# Patient Record
Sex: Female | Born: 1970 | Race: White | Hispanic: No | Smoking: Never smoker
Health system: Southern US, Community
[De-identification: ages and names within clinical notes are randomized; demographics above are authoritative.]

## PROBLEM LIST (undated history)

## (undated) DIAGNOSIS — J302 Other seasonal allergic rhinitis: Secondary | ICD-10-CM

## (undated) DIAGNOSIS — N390 Urinary tract infection, site not specified: Secondary | ICD-10-CM

## (undated) HISTORY — PX: ADENOIDECTOMY: SHX5191

## (undated) HISTORY — PX: MANDIBLE FRACTURE SURGERY: SHX706

## (undated) HISTORY — PX: TUBAL LIGATION: SHX77

---

## 2001-12-16 ENCOUNTER — Encounter: Payer: Self-pay | Admitting: Oral Surgery

## 2001-12-16 ENCOUNTER — Ambulatory Visit (HOSPITAL_COMMUNITY): Admission: RE | Admit: 2001-12-16 | Discharge: 2001-12-16 | Payer: Self-pay | Admitting: Oral Surgery

## 2012-08-18 ENCOUNTER — Emergency Department (INDEPENDENT_AMBULATORY_CARE_PROVIDER_SITE_OTHER): Payer: Managed Care, Other (non HMO)

## 2012-08-18 ENCOUNTER — Encounter: Payer: Self-pay | Admitting: *Deleted

## 2012-08-18 ENCOUNTER — Emergency Department (INDEPENDENT_AMBULATORY_CARE_PROVIDER_SITE_OTHER)
Admission: EM | Admit: 2012-08-18 | Discharge: 2012-08-18 | Disposition: A | Discharge status: Home / Self Care | Payer: Managed Care, Other (non HMO) | Source: Home / Self Care

## 2012-08-18 DIAGNOSIS — N390 Urinary tract infection, site not specified: Secondary | ICD-10-CM

## 2012-08-18 DIAGNOSIS — W19XXXA Unspecified fall, initial encounter: Secondary | ICD-10-CM

## 2012-08-18 DIAGNOSIS — S93409A Sprain of unspecified ligament of unspecified ankle, initial encounter: Secondary | ICD-10-CM

## 2012-08-18 DIAGNOSIS — M25579 Pain in unspecified ankle and joints of unspecified foot: Secondary | ICD-10-CM

## 2012-08-18 HISTORY — DX: Other seasonal allergic rhinitis: J30.2

## 2012-08-18 HISTORY — DX: Urinary tract infection, site not specified: N39.0

## 2012-08-18 LAB — POCT URINALYSIS DIP (MANUAL ENTRY)
Bilirubin, UA: NEGATIVE
Glucose, UA: NEGATIVE
Ketones, POC UA: NEGATIVE
Nitrite, UA: POSITIVE
Protein Ur, POC: NEGATIVE
Spec Grav, UA: <=1.005 (ref 1.005–1.03)
Urobilinogen, UA: 0.2 (ref 0–1)
pH, UA: 6 (ref 5–8)

## 2012-08-18 MED ORDER — NITROFURANTOIN MONOHYD MACRO 100 MG PO CAPS: 100.0000 mg | ORAL_CAPSULE | Freq: Two times a day (BID) | ORAL | Status: AC

## 2012-08-18 NOTE — ED Notes (Signed)
Patient c/o intermittent left ankle pain x 10 days. She is currently training for a 5K, no previous injury to left ankle.  She also report dysuria and urinary frequency x 2 days. Jacqueline Rose has a urologists she sees annually for frequent utis.

## 2012-08-18 NOTE — ED Provider Notes (Signed)
History     CSN: [id number]  Arrival date & time 08/18/12  0806   First MD Initiated Contact with Patient 08/18/12 0815      Chief Complaint  Patient presents with  . Dysuria  . Ankle Pain    left   Patient is a 41 y.o. female presenting with ankle pain and dysuria.  Ankle Pain This is a new problem. The current episode started more than 1 week ago (Pt has been training for a 5k. Has noticed worsening pain after each episode of running. No pain with running. Pain predominantly in lateral ankle. Pain also worse with prolonged standing. No known trauma. Pain relieved with rest and returns with running.l). The symptoms are aggravated by standing (running ). The symptoms are relieved by rest and NSAIDs.  Dysuria   DYSURIA Onset:  2 days  Description: abd pain, dysuria, increased urinary frequency.  Modifying factors: Pt with prior hx/o recurrent UTIs. Has been seen by urology for this in the past. Was on post coital bactrim chronically. This has been effective in managing sxs. Ran out of abx. Has had return of sxs over last 2-3 days.   Symptoms Urgency:  yes Frequency: yes  Hesitancy:  no Hematuria:  no Flank Pain:  no Fever: no Nausea/Vomiting:  mild Missed LMP: no STD exposure: no Discharge: no Irritants: no Rash: no  Red Flags   More than 3 UTI's last 12 months:  2+ PMH of  Diabetes or Immunosuppression:  no Renal Disease/Calculi: no Urinary Tract Abnormality:  no Instrumentation or Trauma: no     Past Medical History  Diagnosis Date  . Frequent UTI   . Seasonal allergies     Past Surgical History  Procedure Date  . Cesarean section   . Adenoidectomy   . Tubal ligation   . Mandible fracture surgery     Family History  Problem Relation Age of Onset  . Cirrhosis Father   . Cancer Father     bladder CA  . Asthma Sister   . Irritable bowel syndrome Sister     History  Substance Use Topics  . Smoking status: Never Smoker   . Smokeless tobacco: Not  on file  . Alcohol Use: No    OB History    Grav Para Term Preterm Abortions TAB SAB Ect Mult Living                  Review of Systems  Genitourinary: Positive for dysuria.  All other systems reviewed and are negative.    Allergies  Ciprofloxacin; Morphine and related; and Penicillins  Home Medications   Current Outpatient Rx  Name Route Sig Dispense Refill  . CALCIUM CARBONATE 1250 MG PO CAPS Oral Take 1,250 mg by mouth 2 (two) times daily with a meal.    . CETIRIZINE HCL 10 MG PO TABS Oral Take 10 mg by mouth daily.    Marland Kitchen CRANBERRY EXTRACT PO Oral Take by mouth.    Marland Kitchen KRILL OIL PO Oral Take by mouth.    . MULTIVITAMINS PO CAPS Oral Take 1 capsule by mouth daily.      BP 113/67  Pulse 90  Temp 98.1 F (36.7 C) (Oral)  Resp 14  Ht 5\' 2"  (1.575 m)  Wt 115 lb (52.164 kg)  BMI 21.03 kg/m2  SpO2 100%  Physical Exam  Constitutional: She appears well-developed and well-nourished.  HENT:  Head: Normocephalic and atraumatic.  Eyes: Conjunctivae are normal. Pupils are equal, round, and reactive  to light.  Neck: Normal range of motion. Neck supple.  Cardiovascular: Normal rate and regular rhythm.   Pulmonary/Chest: Effort normal and breath sounds normal.  Abdominal: Soft.       Mild suprapubic tenderness    Musculoskeletal: Normal range of motion.       Feet:       Ankle: No visible erythema or swelling. Range of motion is full in all directions, though pain with resisted ankle eversion.  Strength is 5/5 in all directions. Stable lateral and medial ligaments; squeeze test and kleiger test unremarkable;  Talar dome nontender; No pain at base of 5th MT; No tenderness over cuboid; No tenderness over N spot or navicular prominence + tenderness over lateral malleolus.  No sign of peroneal tendon subluxations; Negative tarsal tunnel tinel's Able to walk 4 steps.   Neurological: She is alert.  Skin: Skin is warm.    ED Course  Procedures (including critical care  time)   Labs Reviewed  POCT URINALYSIS DIP (MANUAL ENTRY)  URINE CULTURE   No results found.   1. UTI (lower urinary tract infection)   2. Ankle sprain       MDM  1- Will culture. Treat with macrobid. Refer back to urology for follow up appt. Infectious red flags reviewed.   2- xrays negative for fracture. Will place ankle brace. RICE and NSAIDs. Follow up with sports medicine.         Doree Albee, MD 08/18/12 [phone]

## 2012-08-21 NOTE — ED Provider Notes (Signed)
Agree with exam, assessment, and plan.   Lattie Haw, MD 08/21/12 1431

## 2012-08-22 LAB — URINE CULTURE: Colony Count: >=100000

## 2014-05-12 ENCOUNTER — Encounter: Payer: Self-pay | Admitting: Emergency Medicine

## 2014-05-12 ENCOUNTER — Emergency Department (INDEPENDENT_AMBULATORY_CARE_PROVIDER_SITE_OTHER)
Admission: EM | Admit: 2014-05-12 | Discharge: 2014-05-12 | Disposition: A | Discharge status: Home / Self Care | Payer: Managed Care, Other (non HMO) | Source: Home / Self Care | Attending: Emergency Medicine | Admitting: Emergency Medicine

## 2014-05-12 DIAGNOSIS — N390 Urinary tract infection, site not specified: Secondary | ICD-10-CM

## 2014-05-12 DIAGNOSIS — R300 Dysuria: Secondary | ICD-10-CM

## 2014-05-12 LAB — POCT URINALYSIS DIP (MANUAL ENTRY)
BILIRUBIN UA: NEGATIVE
BILIRUBIN UA: NEGATIVE
Glucose, UA: NEGATIVE
Nitrite, UA: NEGATIVE
PROTEIN UA: NEGATIVE
SPEC GRAV UA: 1.01 (ref 1.005–1.03)
Urobilinogen, UA: 0.2 (ref 0–1)
pH, UA: 7 (ref 5–8)

## 2014-05-12 MED ORDER — CEFDINIR 300 MG PO CAPS: 300.0000 mg | ORAL_CAPSULE | Freq: Two times a day (BID) | ORAL | Status: DC

## 2014-05-12 NOTE — ED Provider Notes (Signed)
CSN: [id number]     Arrival date & time 05/12/14  [id number] History   First MD Initiated Contact with Patient 05/12/14 1006     Chief Complaint  Patient presents with  . Urinary Tract Infection   (Consider location/radiation/quality/duration/timing/severity/associated sxs/prior Treatment) HPI Jacqueline Rose is a 43 y.o. female who presents today with UTI symptoms for 1 day.  She has been seen by a urologist in the past but they no longer take her insurance.  She gets frequent UTIs approximately 3-4 times per year.  Per her, no structural defect seen by urologist.  Has been given Keflex in the past post coitus.  The last 2 times she had a UTI, the most recent one in January, she was given Cefdinir which did take care of the UTI. + dysuria + frequency + urgency + hematuria No vaginal discharge No fever/chills No lower abdominal pain + generalized bilatearl back pain No fatigue    Past Medical History  Diagnosis Date  . Frequent UTI   . Seasonal allergies    Past Surgical History  Procedure Laterality Date  . Cesarean section    . Adenoidectomy    . Tubal ligation    . Mandible fracture surgery     Family History  Problem Relation Age of Onset  . Cirrhosis Father   . Cancer Father     bladder CA  . Asthma Sister   . Irritable bowel syndrome Sister    History  Substance Use Topics  . Smoking status: Never Smoker   . Smokeless tobacco: Never Used  . Alcohol Use: No   OB History   Grav Para Term Preterm Abortions TAB SAB Ect Mult Living                 Review of Systems  All other systems reviewed and are negative.   Allergies  Ciprofloxacin; Morphine and related; and Penicillins  Home Medications   Prior to Admission medications   Medication Sig Start Date End Date Taking? Authorizing Provider  calcium carbonate 1250 MG capsule Take 1,250 mg by mouth 2 (two) times daily with a meal.    Historical Provider, MD  cetirizine (ZYRTEC) 10 MG tablet Take 10 mg by mouth daily.     Historical Provider, MD  CRANBERRY EXTRACT PO Take by mouth.    Historical Provider, MD  KRILL OIL PO Take by mouth.    Historical Provider, MD  Multiple Vitamin (MULTIVITAMIN) capsule Take 1 capsule by mouth daily.    Historical Provider, MD   BP 114/72  Pulse 88  Temp(Src) 98.1 F (36.7 C) (Oral)  Resp 14  Wt 118 lb (53.524 kg)  SpO2 100%  LMP 05/07/2014 Physical Exam  Nursing note and vitals reviewed. Constitutional: She is oriented to person, place, and time. She appears well-developed and well-nourished.  HENT:  Head: Normocephalic and atraumatic.  Eyes: No scleral icterus.  Neck: Neck supple.  Cardiovascular: Regular rhythm and normal heart sounds.   Pulmonary/Chest: Effort normal and breath sounds normal. No respiratory distress.  Abdominal: Soft. Normal appearance and bowel sounds are normal. She exhibits no mass. There is no rebound, no guarding and no CVA tenderness.  Neurological: She is alert and oriented to person, place, and time.  Skin: Skin is warm and dry.  Psychiatric: She has a normal mood and affect. Her speech is normal.    ED Course  Procedures (including critical care time) Labs Review Labs Reviewed  URINE CULTURE  POCT URINALYSIS DIP (MANUAL ENTRY)  Imaging Review No results found.   MDM   1. Dysuria   2. Urinary tract infection, site not specified    1) Take the prescribed antibiotic as directed.  Patient is allergic to Cipro hand supposedly has had some Bactrim resistance in the past.  Will place her on septa near and await the culture and sensitivity.  Also gave her [place] 2) A urinalysis was done in clinic.  A urine culture is pending. 3) Follow up with your PCP or urologist if not improving or if worsening symptoms.   Marlaine HindJeffrey H Lissete Maestas, MD 05/12/14 [phone]

## 2014-05-12 NOTE — ED Notes (Signed)
Jacqueline Rose reports hx of frequent UTIs. Seen by urologist but needs new one. Yesterday polyuria and dysuria. Today noted hematuria and flank discomfort. No fever

## 2014-05-15 LAB — URINE CULTURE: Colony Count: 60000

## 2014-05-16 ENCOUNTER — Telehealth: Payer: Self-pay

## 2014-05-16 NOTE — ED Notes (Signed)
I called and spoke with patient and she is doing better. I advised to call back if anything changes or if she has questions or concerns. I advised patient of urine culture results and to finish/complete antibiotic. Also advised to seek medical attention is symptoms persist or worsen.

## 2015-05-16 ENCOUNTER — Emergency Department (INDEPENDENT_AMBULATORY_CARE_PROVIDER_SITE_OTHER): Payer: Managed Care, Other (non HMO)

## 2015-05-16 ENCOUNTER — Emergency Department (INDEPENDENT_AMBULATORY_CARE_PROVIDER_SITE_OTHER)
Admission: EM | Admit: 2015-05-16 | Discharge: 2015-05-16 | Disposition: A | Discharge status: Home / Self Care | Payer: Managed Care, Other (non HMO) | Source: Home / Self Care | Attending: Family Medicine | Admitting: Family Medicine

## 2015-05-16 ENCOUNTER — Encounter: Payer: Self-pay | Admitting: *Deleted

## 2015-05-16 DIAGNOSIS — M25562 Pain in left knee: Secondary | ICD-10-CM | POA: Diagnosis not present

## 2015-05-16 DIAGNOSIS — M7632 Iliotibial band syndrome, left leg: Secondary | ICD-10-CM

## 2015-05-16 NOTE — ED Provider Notes (Signed)
CSN: [id number]     Arrival date & time 05/16/15  1107 History   First MD Initiated Contact with Patient 05/16/15 1234     Chief Complaint  Patient presents with  . Knee Pain      HPI Comments: Patient ran a half-marathon yesterday, and half-way into her run she developed pain in her left knee which she localizes around the patella and laterally.  She was able to finish her race.  She trains regularly and has not had similar pain in her left knee.  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  Her left knee has remained "stiff," and she has pain when climbing stairs.  Patient is a 44 y.o. female presenting with knee pain. The history is provided by the patient.  Knee Pain Location:  Knee Time since incident:  1 day Injury: no   Knee location:  L knee Pain details:    Quality:  Aching   Radiates to:  Does not radiate   Severity:  Mild   Onset quality:  Sudden   Duration:  1 day   Timing:  Constant   Progression:  Worsening Chronicity:  New Dislocation: no   Prior injury to area:  No Relieved by:  Nothing Worsened by:  Extension and flexion Ineffective treatments:  NSAIDs Associated symptoms: stiffness   Associated symptoms: no back pain, no decreased ROM, no muscle weakness, no numbness, no swelling and no tingling     Past Medical History  Diagnosis Date  . Frequent UTI   . Seasonal allergies    Past Surgical History  Procedure Laterality Date  . Cesarean section    . Adenoidectomy    . Tubal ligation    . Mandible fracture surgery     Family History  Problem Relation Age of Onset  . Cirrhosis Father   . Cancer Father     bladder CA  . Asthma Sister   . Irritable bowel syndrome Sister    History  Substance Use Topics  . Smoking status: Never Smoker   . Smokeless tobacco: Never Used  . Alcohol Use: No   OB History    No data available     Review of Systems  Musculoskeletal: Positive for stiffness. Negative for back pain.  All  other systems reviewed and are negative.   Allergies  Ciprofloxacin; Morphine and related; and Penicillins  Home Medications   Prior to Admission medications   Medication Sig Start Date End Date Taking? Authorizing Provider  calcium carbonate 1250 MG capsule Take 1,250 mg by mouth 2 (two) times daily with a meal.    Historical Provider, MD  cetirizine (ZYRTEC) 10 MG tablet Take 10 mg by mouth daily.    Historical Provider, MD  CRANBERRY EXTRACT PO Take by mouth.    Historical Provider, MD  KRILL OIL PO Take by mouth.    Historical Provider, MD  Multiple Vitamin (MULTIVITAMIN) capsule Take 1 capsule by mouth daily.    Historical Provider, MD   BP 120/71 mmHg  Pulse 89  Resp 16  Wt 119 lb (53.978 kg)  SpO2 100%  LMP 05/16/2015 Physical Exam  Constitutional: She appears well-developed and well-nourished. No distress.  Musculoskeletal:       Left knee: She exhibits bony tenderness. She exhibits normal range of motion, no swelling, no effusion, no ecchymosis, no deformity, no erythema, normal alignment, no LCL laxity, normal patellar mobility, normal meniscus and no MCL laxity. Tenderness found. No MCL,  no LCL and no patellar tendon tenderness noted.       Legs: Left knee:  No effusion, erythema, or warmth.  Knee stable, negative drawer test.  McMurray test negative. Distinct tenderness laterally over iliotibial band, worse with flexion/extension while palpating the area   Neurological: She is alert.  Skin: Skin is warm and dry. No rash noted. No erythema.  Nursing note and vitals reviewed.   ED Course  Procedures  none  Imaging Review Dg Knee Complete 4 Views Left  05/16/2015   CLINICAL DATA:  Left knee pain after running a half marathon.  EXAM: LEFT KNEE - COMPLETE 4+ VIEW  COMPARISON:  None.  FINDINGS: There is no evidence of fracture or dislocation. Small joint effusion. There is no evidence of arthropathy or other focal bone abnormality. Soft tissues are unremarkable.   IMPRESSION: No acute osseous injury of the left knee.   Electronically Signed   By: Elige KoHetal  Patel   On: 05/16/2015 12:22     MDM   1. Iliotibial band syndrome, left     Apply ice pack for 20 to 30 minutes, 3 to 4 times daily for 2 to 3 days until pain resolves.  Continue etodolac as prescribed.  Begin knee exercises. Followup with Dr. Rodney Langtonhomas Thekkekandam ([facility] in one week    Lattie HawStephen A Zaylee Cornia, MD 05/17/15 [phone]

## 2015-05-16 NOTE — Discharge Instructions (Signed)
Apply ice pack for 20 to 30 minutes, 3 to 4 times daily for 2 to 3 days until pain resolves.  Continue etodolac as prescribed.  Begin knee exercises.   Iliotibial Band Syndrome Iliotibial band syndrome is pain in the outer, lower thigh. The pain is caused by an inflammation of the iliotibial band. This is a band of thick fibrous tissue that runs down the outside of the thigh. The iliotibial band begins at the hip. It extends to the outer side of the shin bone (tibia) just below the knee joint. The band works with the thigh muscles. Together they provide stability to the outside of the knee joint. Iliotibial band syndrome occurs when there is inflammation to this band of tissue. This is typically due to over use and not due to an injury. The irritation usually occurs over the outside of the knee joint, at the the end of the thigh bone (femur). The iliotibial band crosses bone and muscle at this point. Between these structures is a cushioning sac (bursa). The bursa should make possible a smooth gliding motion. However, when inflamed, the iliotibial band does not glide easily. When inflamed, there is pain with motion of the knee. Usually the pain worsens with continued movement and the pain goes away with rest. This problem usually arises when there is a sudden increase in sports activities involving your legs. Running, and playing soccer or basketball are examples of activities causing this. Others who are prone to iliotibial band syndrome include individuals with mechanical problems such as leg length differences, abnormality of walking, bowed legs etc. HOME CARE INSTRUCTIONS   Apply ice to the injured area:  Put ice in a plastic bag.  Place a towel between your skin and the bag.  Leave the ice on for 20 minutes, 2-3 times a day.  Limit excessive training or eliminate training until pain goes away.  While pain is present, you may use gentle range of motion. Do not resume regular use until  instructed by your health care provider. Begin use gradually. Do not increase activity to the point of pain. If pain does develop, decrease activity and continue the above measures. Gradually increase activities that do not cause discomfort. Do this until you finally achieve normal use.  Perform low-impact activities while pain is present. Wear proper footwear.  Only take over-the-counter or prescription medicines for pain, discomfort, or fever as directed by your health care provider. SEEK MEDICAL CARE IF:   Your pain increases or pain is not controlled with medications.  You develop new, unexplained symptoms, or an increase of the symptoms that brought you to your health care provider.  Your pain and symptoms are not improving or are getting worse. Document Released: 06/08/2002 Document Revised: 10/07/2013 Document Reviewed: 07/16/2013 [facility] Patient Information 2015 [employer] [place] [place]. This information is not intended to replace advice given to you by your health care provider. Make sure you discuss any questions you have with your health care provider.

## 2015-05-16 NOTE — ED Notes (Signed)
Pt c/o left knee pain x yesterday while running a 1/2 Marathon. She did finish the race. No swelling present, no previous injury but does have arthritis in her knee. She is on Etodolac for right knee pain/fluid on knee in [place]ril.

## 2019-11-01 ENCOUNTER — Other Ambulatory Visit: Payer: Self-pay

## 2019-11-01 ENCOUNTER — Emergency Department (INDEPENDENT_AMBULATORY_CARE_PROVIDER_SITE_OTHER)
Admission: EM | Admit: 2019-11-01 | Discharge: 2019-11-01 | Disposition: A | Discharge status: Home / Self Care | Payer: 59 | Source: Home / Self Care

## 2019-11-01 ENCOUNTER — Encounter: Payer: Self-pay | Admitting: *Deleted

## 2019-11-01 DIAGNOSIS — R300 Dysuria: Secondary | ICD-10-CM

## 2019-11-01 DIAGNOSIS — N3001 Acute cystitis with hematuria: Secondary | ICD-10-CM

## 2019-11-01 LAB — POCT URINALYSIS DIP (MANUAL ENTRY)
Bilirubin, UA: NEGATIVE
Glucose, UA: NEGATIVE mg/dL
Ketones, POC UA: NEGATIVE mg/dL
Nitrite, UA: NEGATIVE
Protein Ur, POC: NEGATIVE mg/dL
Spec Grav, UA: 1.015 (ref 1.010–1.025)
Urobilinogen, UA: 0.2 E.U./dL
pH, UA: 7 (ref 5.0–8.0)

## 2019-11-01 MED ORDER — CEPHALEXIN 500 MG PO CAPS: 500.0000 mg | ORAL_CAPSULE | Freq: Two times a day (BID) | ORAL | 0 refills | Status: AC

## 2019-11-01 NOTE — Discharge Instructions (Signed)
°  Please take your antibiotic as prescribed. A urine culture has been sent to check the severity of your urinary infection and to determine if you are on the most appropriate antibiotic. The results should come back within 2-3 days and you will be notified even if no medication change is needed.  Please stay well hydrated and follow up with your family doctor in 4-5 days if not improving, sooner if worsening.

## 2019-11-01 NOTE — ED Provider Notes (Signed)
[facility]    CSN: [id number] Arrival date & time: 11/01/19  0809      History   Chief Complaint Chief Complaint  Patient presents with  . Dysuria    HPI Jacqueline Rose is a 48 y.o. female.   HPI  Jacqueline Rose is a 48 y.o. female presenting to UC with c/o 4 days worsening burning and irritation with urination.  Cloudy urine.  She is unsure of blood in urine as she is on her menstrual cycle but has not noticed any blood while peeing.  She reports hx of frequent UTIs in the past, has not had one in a few years but was advised about 6 weeks ago during her annual physical she had an asymptomatic UTI.  She was not treated with antibiotics due to not having symptoms but is concerned it has developed into a worse UTI now. Denies fever, chills, n/v/d.   Past Medical History:  Diagnosis Date  . Frequent UTI   . Seasonal allergies     There are no active problems to display for this patient.   Past Surgical History:  Procedure Laterality Date  . ADENOIDECTOMY    . CESAREAN SECTION    . MANDIBLE FRACTURE SURGERY    . TUBAL LIGATION      OB History   No obstetric history on file.      Home Medications    Prior to Admission medications   Medication Sig Start Date End Date Taking? Authorizing Provider  calcium carbonate 1250 MG capsule Take 1,250 mg by mouth 2 (two) times daily with a meal.   Yes [provider]  cetirizine (ZYRTEC) 10 MG tablet Take 10 mg by mouth daily.   Yes [provider]  CRANBERRY EXTRACT PO Take by mouth.   Yes [provider]  KRILL OIL PO Take by mouth.   Yes [provider]  Multiple Vitamin (MULTIVITAMIN) capsule Take 1 capsule by mouth daily.   Yes [provider]  cephALEXin (KEFLEX) 500 MG capsule Take 1 capsule (500 mg total) by mouth 2 (two) times daily. 11/01/19   Noe Gens, PA-C    Family History Family History  Problem Relation Age of Onset  . Cirrhosis Father   .  Cancer Father        bladder CA  . Asthma Sister   . Irritable bowel syndrome Sister     Social History Social History   Tobacco Use  . Smoking status: Never Smoker  . Smokeless tobacco: Never Used  Substance Use Topics  . Alcohol use: No  . Drug use: No     Allergies   Ciprofloxacin, Morphine and related, and Penicillins   Review of Systems Review of Systems  Constitutional: Negative for chills and fever.  Gastrointestinal: Positive for abdominal pain (mild bladder pressure). Negative for diarrhea, nausea and vomiting.  Genitourinary: Positive for dysuria, frequency, hematuria (? cloudy) and vaginal bleeding (on her menses ). Negative for vaginal discharge and vaginal pain.     Physical Exam Triage Vital Signs ED Triage Vitals  Enc Vitals Group     BP 11/01/19 0824 115/69     Pulse Rate 11/01/19 0824 85     Resp 11/01/19 0824 14     Temp 11/01/19 0824 98 F (36.7 C)     Temp Source 11/01/19 0824 Oral     SpO2 11/01/19 0824 100 %     Weight 11/01/19 0825 122 lb (55.3 kg)  Height 11/01/19 0825 5\' 2"  (1.575 m)     Head Circumference --      Peak Flow --      Pain Score 11/01/19 0825 0     Pain Loc --      Pain Edu? --      Excl. in GC? --    No data found.  Updated Vital Signs BP 115/69 (BP Location: Right Arm)   Pulse 85   Temp 98 F (36.7 C) (Oral)   Resp 14   Ht 5\' 2"  (1.575 m)   Wt 122 lb (55.3 kg)   LMP 10/30/2019   SpO2 100%   BMI 22.31 kg/m   Visual Acuity Right Eye Distance:   Left Eye Distance:   Bilateral Distance:    Right Eye Near:   Left Eye Near:    Bilateral Near:     Physical Exam Vitals signs and nursing note reviewed.  Constitutional:      Appearance: Normal appearance. She is well-developed.  HENT:     Head: Normocephalic and atraumatic.  Neck:     Musculoskeletal: Normal range of motion.  Cardiovascular:     Rate and Rhythm: Normal rate and regular rhythm.  Pulmonary:     Effort: Pulmonary effort is normal.      Breath sounds: Normal breath sounds.  Abdominal:     General: There is no distension.     Palpations: Abdomen is soft.     Tenderness: There is abdominal tenderness (mild, suprapubic). There is no right CVA tenderness, left CVA tenderness, guarding or rebound.  Musculoskeletal: Normal range of motion.  Skin:    General: Skin is warm and dry.  Neurological:     Mental Status: She is alert and oriented to person, place, and time.  Psychiatric:        Behavior: Behavior normal.      UC Treatments / Results  Labs (all labs ordered are listed, but only abnormal results are displayed) Labs Reviewed  POCT URINALYSIS DIP (MANUAL ENTRY) - Abnormal; Notable for the following components:      Result Value   Clarity, UA cloudy (*)    Blood, UA large (*)    Leukocytes, UA Small (1+) (*)    All other components within normal limits  URINE CULTURE  POCT URINALYSIS DIP (MANUAL ENTRY)    EKG   Radiology No results found.  Procedures Procedures (including critical care time)  Medications Ordered in UC Medications - No data to display  Initial Impression / Assessment and Plan / UC Course  I have reviewed the triage vital signs and the nursing notes.  Pertinent labs & imaging results that were available during my care of the patient were reviewed by me and considered in my medical decision making (see chart for details).     Hx and UA c/w UTI Urine Culture sent AVS provided  Final Clinical Impressions(s) / UC Diagnoses   Final diagnoses:  Dysuria  Acute cystitis with hematuria     Discharge Instructions      Please take your antibiotic as prescribed. A urine culture has been sent to check the severity of your urinary infection and to determine if you are on the most appropriate antibiotic. The results should come back within 2-3 days and you will be notified even if no medication change is needed.  Please stay well hydrated and follow up with your family doctor in 4-5  days if not improving, sooner if worsening.  ED Prescriptions    Medication Sig Dispense Auth. Provider   cephALEXin (KEFLEX) 500 MG capsule Take 1 capsule (500 mg total) by mouth 2 (two) times daily. 14 capsule Lurene Shadow, [place]     PDMP not reviewed this encounter.   Lurene Shadow, [place] 11/01/19 [phone]

## 2019-11-01 NOTE — ED Triage Notes (Addendum)
Patient c/o dysuria x 4 days. No otc meds taken. H/o frequent UTI several years ago. Recent UTI 6 weeks ago

## 2019-11-03 LAB — URINE CULTURE
MICRO NUMBER:: 1053581
SPECIMEN QUALITY:: ADEQUATE

## 2019-11-05 ENCOUNTER — Telehealth: Payer: Self-pay | Admitting: Emergency Medicine

## 2019-11-05 NOTE — Telephone Encounter (Signed)
Urine culture was positive for  E coli and was given keflex  at urgent care visit. Attempted to reach patient. No answer at this time.

## 2021-01-19 ENCOUNTER — Encounter: Payer: Self-pay | Admitting: Emergency Medicine

## 2021-01-19 ENCOUNTER — Emergency Department (INDEPENDENT_AMBULATORY_CARE_PROVIDER_SITE_OTHER): Payer: PRIVATE HEALTH INSURANCE

## 2021-01-19 ENCOUNTER — Emergency Department (INDEPENDENT_AMBULATORY_CARE_PROVIDER_SITE_OTHER)
Admission: EM | Admit: 2021-01-19 | Discharge: 2021-01-19 | Disposition: A | Discharge status: Home / Self Care | Payer: PRIVATE HEALTH INSURANCE | Source: Home / Self Care

## 2021-01-19 ENCOUNTER — Other Ambulatory Visit: Payer: Self-pay

## 2021-01-19 DIAGNOSIS — W010XXA Fall on same level from slipping, tripping and stumbling without subsequent striking against object, initial encounter: Secondary | ICD-10-CM

## 2021-01-19 DIAGNOSIS — S63601A Unspecified sprain of right thumb, initial encounter: Secondary | ICD-10-CM

## 2021-01-19 DIAGNOSIS — M79641 Pain in right hand: Secondary | ICD-10-CM | POA: Diagnosis not present

## 2021-01-19 NOTE — ED Triage Notes (Signed)
Pt slipped on ice on Monday., landed on R hand & thumb Bruising and swelling noted  No OTC meds Limited ROM

## 2021-01-19 NOTE — Discharge Instructions (Signed)
  You may alternate tylenol and motrin as needed for pain and inflammation. You should wear the wrist splint for 1-2 weeks for comfort and protection. You may briefly remove to bath or apply a cool compress. If not improving in 1-2 weeks, call to schedule a follow up appointment with Sports Medicine for further evaluation and treatment.

## 2021-01-19 NOTE — ED Provider Notes (Signed)
[name]    CSN: [id number] Arrival date & time: 01/19/21  0859      History   Chief Complaint Chief Complaint  Patient presents with  . Hand Injury    Right    HPI Jacqueline Rose is a 50 y.o. female.   HPI Jacqueline Rose is a 50 y.o. female presenting to UC with c/o Right thumb and hand pain with mild swelling and bruising that started 3 days ago after falling and slipping on some ice while shoveling snow.  Pain is aching and sore, 3/10, worse with flexing her thumb fully.  She is Right hand dominant.    Past Medical History:  Diagnosis Date  . Frequent UTI   . Seasonal allergies     There are no problems to display for this patient.   Past Surgical History:  Procedure Laterality Date  . ADENOIDECTOMY    . CESAREAN SECTION    . MANDIBLE FRACTURE SURGERY    . TUBAL LIGATION      OB History   No obstetric history on file.      Home Medications    Prior to Admission medications   Medication Sig Start Date End Date Taking? Authorizing Provider  calcium carbonate 1250 MG capsule Take 1,250 mg by mouth 2 (two) times daily with a meal.   Yes [provider]  Ferrous Sulfate (IRON) 325 (65 Fe) MG TABS Take by mouth.   Yes [provider]  KRILL OIL PO Take by mouth.   Yes [provider]  Multiple Vitamin (MULTIVITAMIN) capsule Take 1 capsule by mouth daily.   Yes [provider]  cephALEXin (KEFLEX) 500 MG capsule Take 1 capsule (500 mg total) by mouth 2 (two) times daily. Patient not taking: Reported on 01/19/2021 11/01/19   Lurene Shadow, PA-C  cetirizine (ZYRTEC) 10 MG tablet Take 10 mg by mouth daily.    [provider]  CRANBERRY EXTRACT PO Take by mouth.    [provider]    Family History Family History  Problem Relation Age of Onset  . Cirrhosis Father   . Cancer Father        bladder CA  . Asthma Sister   . Irritable bowel syndrome Sister   . Supraventricular tachycardia Mother      Social History Social History   Tobacco Use  . Smoking status: Never Smoker  . Smokeless tobacco: Never Used  Vaping Use  . Vaping Use: Never used  Substance Use Topics  . Alcohol use: No  . Drug use: No     Allergies   Ciprofloxacin, Morphine and related, and Penicillins   Review of Systems Review of Systems  Musculoskeletal: Positive for arthralgias and joint swelling.  Skin: Positive for color change. Negative for wound.  Neurological: Positive for weakness ( right thumb). Negative for numbness.     Physical Exam Triage Vital Signs ED Triage Vitals  Enc Vitals Group     BP 01/19/21 0918 106/63     Pulse Rate 01/19/21 0918 90     Resp 01/19/21 0918 15     Temp 01/19/21 0918 98.5 F (36.9 C)     Temp Source 01/19/21 0918 Oral     SpO2 01/19/21 0918 99 %     Weight 01/19/21 0922 117 lb (53.1 kg)     Height 01/19/21 0922 5' 2.5" (1.588 m)     Head Circumference --      Peak Flow --  Pain Score 01/19/21 0921 3     Pain Loc --      Pain Edu? --      Excl. in GC? --    No data found.  Updated Vital Signs BP 106/63 (BP Location: Left Arm)   Pulse 90   Temp 98.5 F (36.9 C) (Oral)   Resp 15   Ht 5' 2.5" (1.588 m)   Wt 117 lb (53.1 kg)   LMP 01/12/2021 (Exact Date)   SpO2 99%   BMI 21.06 kg/m   Visual Acuity Right Eye Distance:   Left Eye Distance:   Bilateral Distance:    Right Eye Near:   Left Eye Near:    Bilateral Near:     Physical Exam Vitals and nursing note reviewed.  Constitutional:      Appearance: Normal appearance. She is well-developed and well-nourished.  HENT:     Head: Normocephalic and atraumatic.  Eyes:     Extraocular Movements: EOM normal.  Cardiovascular:     Rate and Rhythm: Normal rate and regular rhythm.     Pulses:          Radial pulses are 2+ on the right side.  Pulmonary:     Effort: Pulmonary effort is normal.  Musculoskeletal:        General: Swelling and tenderness present. Normal range of motion.      Cervical back: Normal range of motion.     Comments: Right thumb: mild edema at first MCP joint, tender, slight decreased flexion due to pain. Full ROM at PIP joint.  No snuffbox tenderness. Full ROM Right wrist, non-tender.  Skin:    General: Skin is warm and dry.     Capillary Refill: Capillary refill takes less than 2 seconds.     Findings: Bruising (faint over proximal thumb and first metacarpal) present.  Neurological:     Mental Status: She is alert and oriented to person, place, and time.     Sensory: No sensory deficit.  Psychiatric:        Mood and Affect: Mood and affect normal.        Behavior: Behavior normal.      UC Treatments / Results  Labs (all labs ordered are listed, but only abnormal results are displayed) Labs Reviewed - No data to display  EKG   Radiology DG Hand Complete Right  Result Date: 01/19/2021 CLINICAL DATA:  Right hand pain after fall. EXAM: RIGHT HAND - COMPLETE 3+ VIEW COMPARISON:  None. FINDINGS: There is no evidence of fracture or dislocation. There is no evidence of arthropathy or other focal bone abnormality. Soft tissues are unremarkable. IMPRESSION: Negative. Electronically Signed   By: Lupita Raider M.D.   On: 01/19/2021 09:50    Procedures Procedures (including critical care time)  Medications Ordered in UC Medications - No data to display  Initial Impression / Assessment and Plan / UC Course  I have reviewed the triage vital signs and the nursing notes.  Pertinent labs & imaging results that were available during my care of the patient were reviewed by me and considered in my medical decision making (see chart for details).     Discussed imaging with pt. Will tx as sprain Thumb spica splint applied for comfort and protection F/u with Sports medicine in 1-2 weeks if not improving.  Final Clinical Impressions(s) / UC Diagnoses   Final diagnoses:  Sprain of right thumb, unspecified site of digit, initial encounter      Discharge Instructions  You may alternate tylenol and motrin as needed for pain and inflammation. You should wear the wrist splint for 1-2 weeks for comfort and protection. You may briefly remove to bath or apply a cool compress. If not improving in 1-2 weeks, call to schedule a follow up appointment with Sports Medicine for further evaluation and treatment.     ED Prescriptions    None     PDMP not reviewed this encounter.   Lurene Shadow, PA-C 01/19/21 1000

## 2022-03-30 IMAGING — DX DG HAND COMPLETE 3+V*R*
3 series · 3 of 3 positions shown · non-contrast
Comparison: None.

CLINICAL DATA: Right hand pain after fall.

EXAM:
RIGHT HAND - COMPLETE 3+ VIEW

[hand pa]
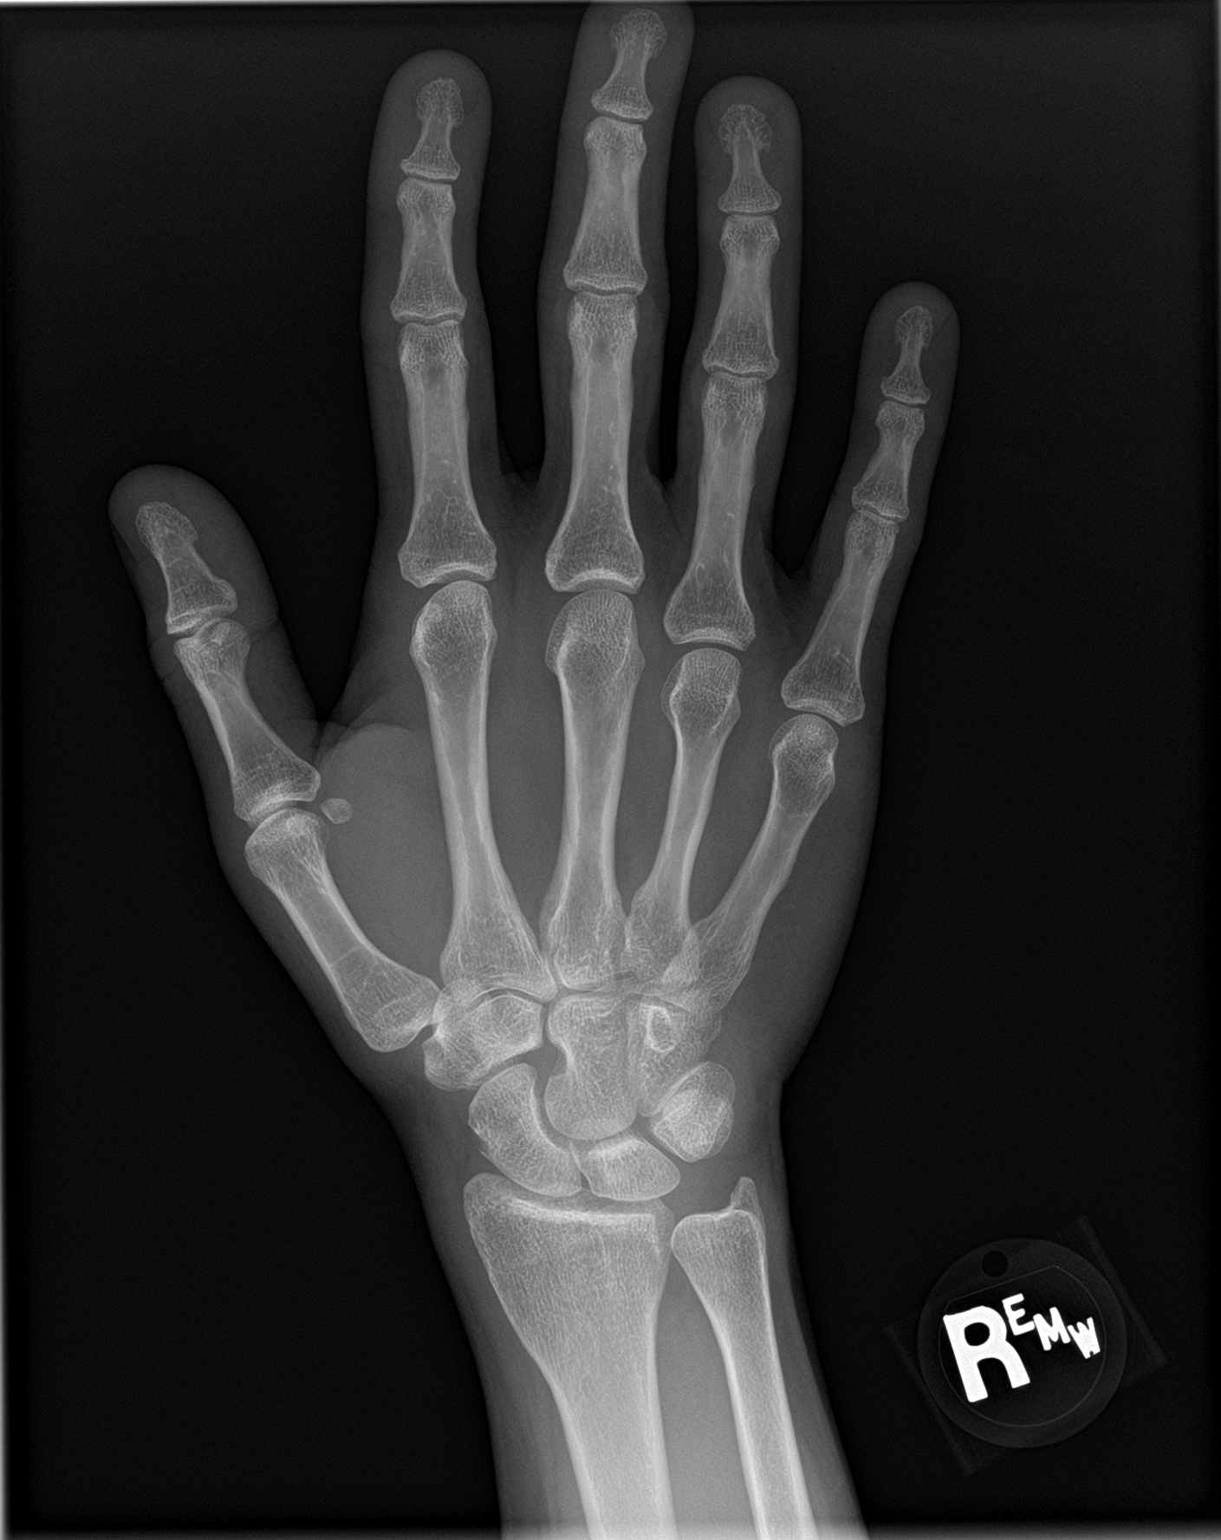

[hand obl]
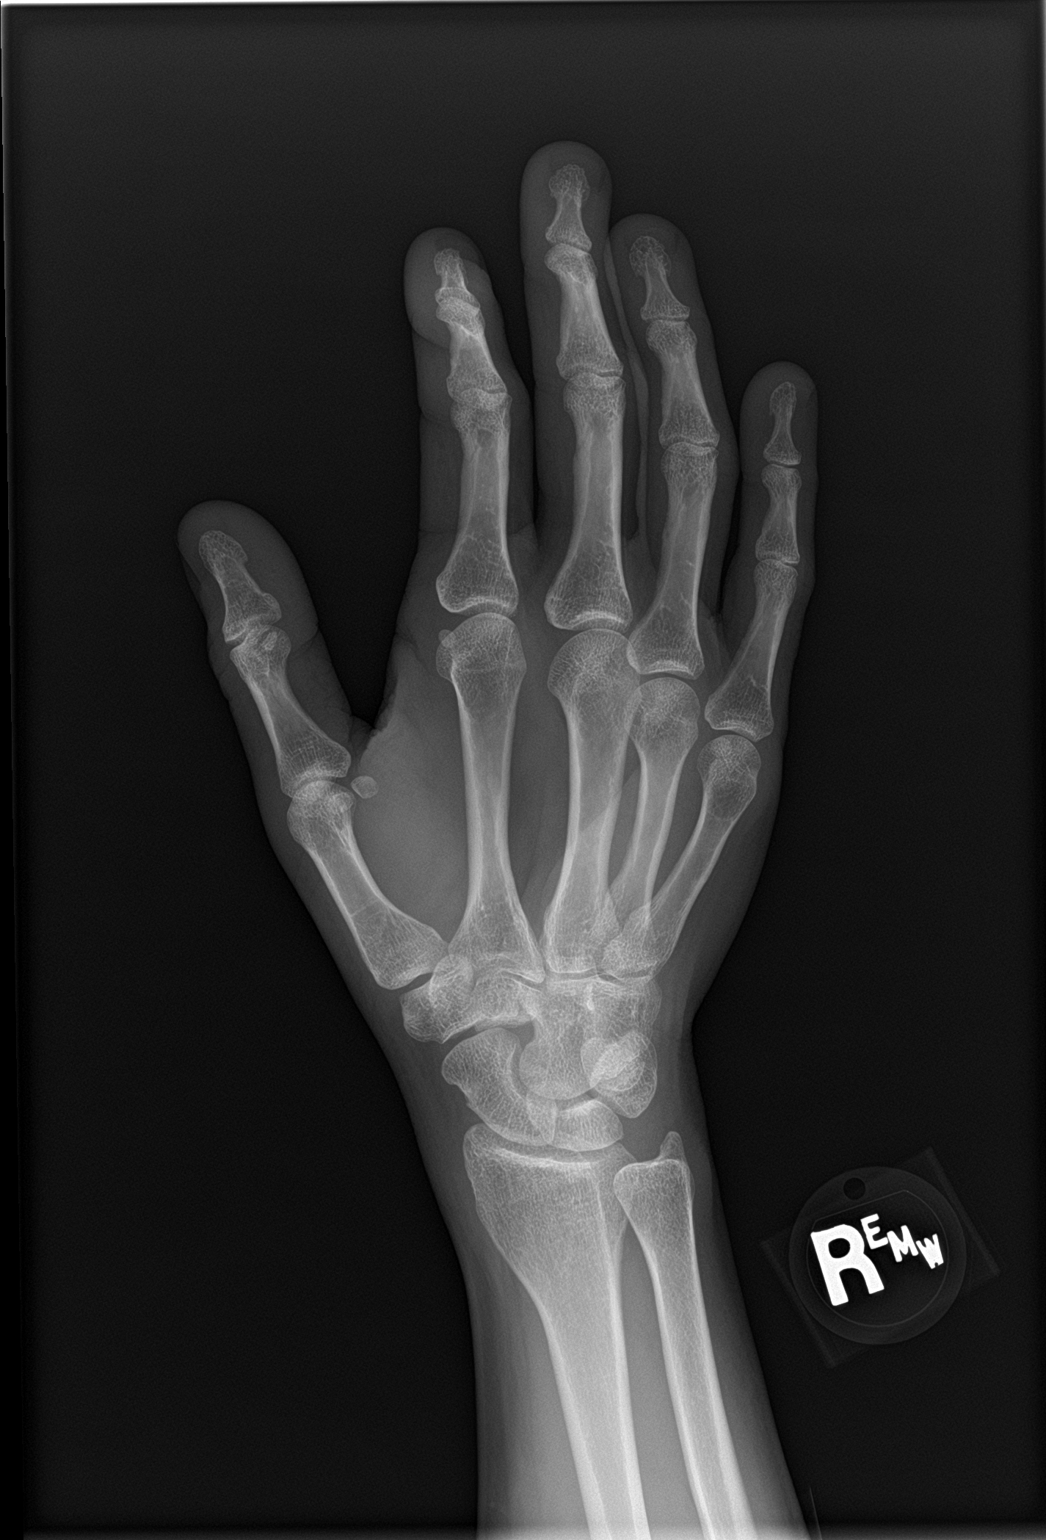

[hand lat]
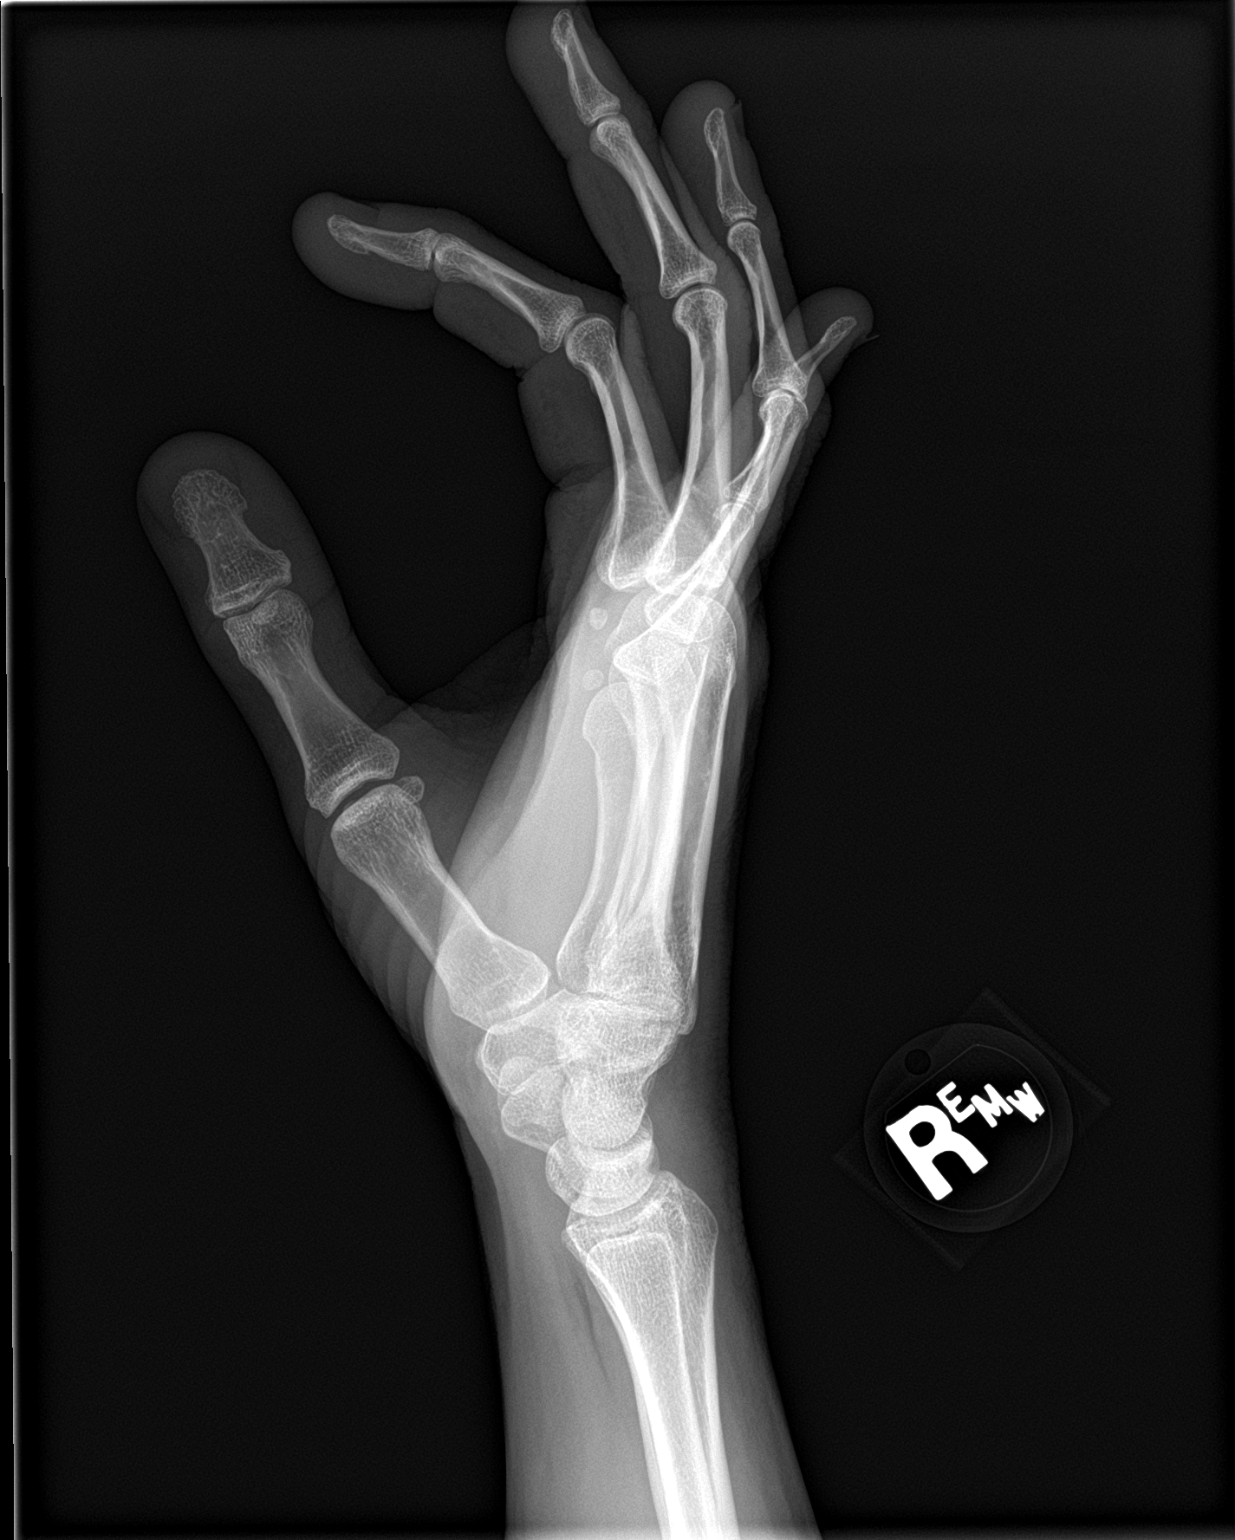

[3 of 3 positions shown; findings below may reference images not displayed]

FINDINGS: There is no evidence of fracture or dislocation. There is no
evidence of arthropathy or other focal bone abnormality. Soft
tissues are unremarkable.
IMPRESSION: Negative.
